# Patient Record
Sex: Male | Born: 1950 | Race: White | Hispanic: No | Marital: Married | Smoking: Never smoker
Health system: Southern US, Community
[De-identification: ages and names within clinical notes are randomized; demographics above are authoritative.]

---

## 2017-11-30 ENCOUNTER — Encounter (HOSPITAL_COMMUNITY): Payer: Self-pay | Admitting: Internal Medicine

## 2017-11-30 ENCOUNTER — Emergency Department (HOSPITAL_COMMUNITY): Payer: Medicare Other

## 2017-11-30 ENCOUNTER — Emergency Department (HOSPITAL_COMMUNITY)
Admission: EM | Admit: 2017-11-30 | Discharge: 2017-11-30 | Disposition: A | Discharge status: Home / Self Care | Payer: Medicare Other | Attending: Emergency Medicine | Admitting: Emergency Medicine

## 2017-11-30 DIAGNOSIS — W010XXA Fall on same level from slipping, tripping and stumbling without subsequent striking against object, initial encounter: Secondary | ICD-10-CM | POA: Diagnosis not present

## 2017-11-30 DIAGNOSIS — Y998 Other external cause status: Secondary | ICD-10-CM | POA: Diagnosis not present

## 2017-11-30 DIAGNOSIS — Y9289 Other specified places as the place of occurrence of the external cause: Secondary | ICD-10-CM | POA: Diagnosis not present

## 2017-11-30 DIAGNOSIS — Y939 Activity, unspecified: Secondary | ICD-10-CM | POA: Insufficient documentation

## 2017-11-30 DIAGNOSIS — S53194A Other dislocation of right ulnohumeral joint, initial encounter: Secondary | ICD-10-CM | POA: Diagnosis not present

## 2017-11-30 DIAGNOSIS — R52 Pain, unspecified: Secondary | ICD-10-CM

## 2017-11-30 DIAGNOSIS — S59901A Unspecified injury of right elbow, initial encounter: Secondary | ICD-10-CM | POA: Diagnosis present

## 2017-11-30 DIAGNOSIS — S53104A Unspecified dislocation of right ulnohumeral joint, initial encounter: Secondary | ICD-10-CM

## 2017-11-30 MED ORDER — PROPOFOL 10 MG/ML IV BOLUS
100.0000 mg | Freq: Once | INTRAVENOUS | Status: AC
  Administered 2017-11-30: 80 mg via INTRAVENOUS
  Filled 2017-11-30: qty 20

## 2017-11-30 MED ORDER — FENTANYL CITRATE (PF) 100 MCG/2ML IJ SOLN
50.0000 ug | Freq: Once | INTRAMUSCULAR | Status: AC
  Administered 2017-11-30: 50 ug via INTRAVENOUS
  Filled 2017-11-30: qty 2

## 2017-11-30 MED ORDER — FENTANYL CITRATE (PF) 100 MCG/2ML IJ SOLN
100.0000 ug | Freq: Once | INTRAMUSCULAR | Status: AC
  Administered 2017-11-30: 100 ug via INTRAVENOUS
  Filled 2017-11-30: qty 2

## 2017-11-30 MED ORDER — HYDROCODONE-ACETAMINOPHEN 5-325 MG PO TABS: 1.0000 | ORAL_TABLET | Freq: Four times a day (QID) | ORAL | 0 refills | Status: AC | PRN

## 2017-11-30 NOTE — Discharge Instructions (Signed)
Follow up with orthopedics next week.  Return to the ED for any worsening or other concerns.  Take tylenol or motrin as needed for pain.  If pain, numbness, or swelling worsens please return for further evaluation.

## 2017-11-30 NOTE — ED Triage Notes (Addendum)
Pt here after falling when exiting a crawl space at a client's home. Reportedly fell on his right elbow. Obvious deformity to elbow with possible dislocation per EMS. Denies LOC, did not hit head. Pulses present distal to injury. VSS for EMS. Given fentanyl.

## 2017-11-30 NOTE — ED Notes (Signed)
Ortho tech paged and is aware of need for long arm splint.

## 2017-11-30 NOTE — ED Provider Notes (Signed)
[facility] EMERGENCY DEPARTMENT Provider Note   CSN: [id number] Arrival date & time: 11/30/17  1433     History   Chief Complaint Chief Complaint  Patient presents with  . Elbow Injury    HPI Benjamin Dawson is a 67 y.o. male.  HPI  Patient is a 67 year old male with PMHx of HTN who presents with right elbow pain s/p FOOSH.  Patient states he was exiting a crawlspace while at a client's home for mold decontamination when he tripped and fell, landing on right elbow.  Denies any head trauma or LOC.  Denies any other injury.  Pain is currently described as achy, constant, and rated as a 6/10 in intensity.  He complains of mild numbness to lateral fifth digit otherwise denies any other paresthesias.  He is unable to range his elbow secondary to pain.  Placed in splint and given fentanyl PTA of by EMS.  Otherwise normal state of health prior to fall.  History reviewed. No pertinent past medical history.  There are no active problems to display for this patient.   History reviewed. No pertinent surgical history.      Home Medications    Prior to Admission medications   Medication Sig Start Date End Date Taking? Authorizing Provider  HYDROcodone-acetaminophen (NORCO/VICODIN) 5-325 MG tablet Take 1 tablet by mouth every 6 (six) hours as needed for up to 3 days. 11/30/17 12/03/17  Abelardo Diesel, MD    Family History No family history on file.  Social History Social History   Tobacco Use  . Smoking status: Not on file  Substance Use Topics  . Alcohol use: Not on file  . Drug use: Not on file     Allergies   Patient has no allergy information on record.   Review of Systems Review of Systems  Constitutional: Negative for chills and fever.  HENT: Negative for sore throat.   Eyes: Negative for pain and visual disturbance.  Respiratory: Negative for cough and shortness of breath.   Cardiovascular: Negative for chest pain and palpitations.    Gastrointestinal: Negative for abdominal pain, diarrhea, nausea and vomiting.  Genitourinary: Negative for dysuria and hematuria.  Musculoskeletal:       +R elbow and forearm pain  Skin: Negative for color change, rash and wound.  Neurological: Negative for seizures and syncope.  All other systems reviewed and are negative.    Physical Exam Updated Vital Signs BP (!) 141/75   Pulse 88   Temp 99.2 F (37.3 C) (Oral)   Resp 11   SpO2 97%   Physical Exam  Constitutional: He is oriented to person, place, and time. He appears well-developed and well-nourished. No distress.  HENT:  Head: Normocephalic and atraumatic.  Mouth/Throat: Oropharynx is clear and moist.  Eyes: Pupils are equal, round, and reactive to light. Conjunctivae are normal.  Neck: Normal range of motion. Neck supple.  Cardiovascular: Normal rate, regular rhythm and intact distal pulses.  Pulmonary/Chest: Effort normal and breath sounds normal. No respiratory distress.  Abdominal: Soft. He exhibits no distension. There is no tenderness. There is no guarding.  Musculoskeletal:  RUE INSPECTION:Obvious deformity to right elbow, appears dislocated. No open wounds. PALPATION: Tenderness to palpation over elbow.   ROM: Limited ROM of elbow 2/2 pain.  Full ROM of wrist. VASCULAR: Extremity warm and well-perfused. 2+ radial pulses. Capillary refill <2 seconds NEURO-SENSORY: Sensation is intact to light touch.  Subjective numbness over lateral 5th digit. NEURO-MOTOR: Motor function is intact. COMPARTMENTS: Soft.  No pain with passive stretch.   Neurological: He is alert and oriented to person, place, and time.  Skin: Skin is warm and dry. Capillary refill takes less than 2 seconds. No rash noted.  Psychiatric: He has a normal mood and affect.  Nursing note and vitals reviewed.    ED Treatments / Results  Labs (all labs ordered are listed, but only abnormal results are displayed) Labs Reviewed - No data to  display  EKG None  Radiology Dg Elbow 2 Views Right  Result Date: 11/30/2017 CLINICAL DATA:  Reduction of right elbow EXAM: RIGHT ELBOW - 2 VIEW COMPARISON:  Earlier today FINDINGS: Limited positioning given the clinical setting. Elbow has been relocated. There is marked soft tissue swelling, especially medially. No definite fracture - bony densities anteriorly appear corticated. IMPRESSION: 1. Relocated elbow. 2. Suspected small fracture fragments on prior are not clearly localized currently. 3. Limited positioning. Electronically Signed   By: Marnee Spring M.D.   On: 11/30/2017 19:26   Dg Elbow 2 Views Right  Result Date: 11/30/2017 CLINICAL DATA:  Fall today on outstretched right hand injuring elbow. EXAM: RIGHT ELBOW - 2 VIEW COMPARISON:  None. FINDINGS: Examination demonstrates dislocation at the elbow joint with rotation of the humerus. There are a few small fragments over the joint likely associated acute chip fractures without definite donor site the AP film demonstrates the distal humerus to be mostly dislocated medial and slightly posterior to the elbow joint. IMPRESSION: Right elbow dislocation. Few tiny associated fragments likely acute chip fractures without definite donor site. Electronically Signed   By: Elberta Fortis M.D.   On: 11/30/2017 17:04   Dg Forearm Right  Result Date: 11/30/2017 CLINICAL DATA:  Fall on an outstretched hand out of a crawlspace today. Initial encounter. EXAM: RIGHT FOREARM - 2 VIEW COMPARISON:  Plain films of the elbow this same day. FINDINGS: The elbow is dislocated.  No other acute abnormality is identified. IMPRESSION: Dislocated elbow as seen on dedicated plain films this same day. No other acute abnormality. Electronically Signed   By: Drusilla Kanner M.D.   On: 11/30/2017 17:02   Dg Humerus Right  Result Date: 11/30/2017 CLINICAL DATA:  Fall on an outstretched hand out of a crawlspace today. Initial encounter. EXAM: RIGHT HUMERUS - 2+ VIEW  COMPARISON:  Plain films of the right elbow this same day. FINDINGS: The elbow is dislocated as seen on dedicated plain films of the elbow this same day. The humerus is intact and the shoulder is located. IMPRESSION: Dislocated right elbow.  No other acute abnormality. Electronically Signed   By: Drusilla Kanner M.D.   On: 11/30/2017 17:02    Procedures Reduction of dislocation Date/Time: 11/30/2017 7:01 PM Performed by: Abelardo Diesel, MD Authorized by: Maia Plan, MD  Consent: Written consent obtained. Risks and benefits: risks, benefits and alternatives were discussed Consent given by: patient Patient understanding: patient states understanding of the procedure being performed Imaging studies: imaging studies available Required items: required blood products, implants, devices, and special equipment available Time out: Immediately prior to procedure a "time out" was called to verify the correct patient, procedure, equipment, support staff and site/side marked as required. Local anesthesia used: no  Anesthesia: Local anesthesia used: no  Sedation: Patient sedated: yes Vitals: Vital signs were monitored during sedation.  Patient tolerance: Patient tolerated the procedure well with no immediate complications Comments: Right elbow dislocation successfully reduced.  Neurovascularly intact 2+ radial pulses and intact sensation.  Elbow with full range of  motion.  Sling placed.    (including critical care time)  Medications Ordered in ED Medications  fentaNYL (SUBLIMAZE) injection 50 mcg (50 mcg Intravenous Given 11/30/17 1618)  propofol (DIPRIVAN) 10 mg/mL bolus/IV push 100 mg (80 mg Intravenous Given 11/30/17 1849)  fentaNYL (SUBLIMAZE) injection 100 mcg (100 mcg Intravenous Given 11/30/17 1847)     Initial Impression / Assessment and Plan / ED Course  I have reviewed the triage vital signs and the nursing notes.  Pertinent labs & imaging results that were available during my  care of the patient were reviewed by me and considered in my medical decision making (see chart for details).     Patient is a 67 year old male with PMHx of HTN who presents with right elbow pain s/p FOOSH with obvious deformity to right elbow.  On arrival he is HDS and well appearing.  Exam as above.  NVI throughout.  Subjective complaints of lateral 5th digit numbness.  XR R elbow seen for right elbow dislocation.  Few tiny associated fragments likely acute chip fractures.   XR R forearm without acute abnormality. XR R humerus with dislocated right elbow otherwise no acute abnormality.  Right elbow was successfully reduced as procedure note above using propofol sedation and fentanyl for pain.  He is neurovascular intact.  Postreduction films demonstrated reduced elbow.  No fractures identified.  Ortho consulted who recommends posterior long arm splint and follow-up next week. After splint application, patient has significant improvement of pain.  Remains neurovascularly intact with strong radial pulses and < 2 sec capillary refill.  He does continue to endorse mild numbness to lateral 5th digit otherwise denies all other paresthesias.  Etiology likely 2/2 swelling.  Will give short course of Norco. Stable for d/c home.  Old records reviewed.  Imaging and labs reviewed and interpreted by myself and attending and used in the MDM.  Addressed patient question and concerns.  Reviewed discharged instructions with strict precautions given.  Advised patient to schedule follow-up with primary care provider.  Patient verbalized understanding and agrees with plan.  Patient stable at discharge.  The plan for this patient was discussed with Dr. Jacqulyn Bath who voiced agreement and who oversaw evaluation and treatment of this patient.  Final Clinical Impressions(s) / ED Diagnoses   Final diagnoses:  Closed dislocation of right elbow, initial encounter    ED Discharge Orders         Ordered     HYDROcodone-acetaminophen (NORCO/VICODIN) 5-325 MG tablet  Every 6 hours PRN     11/30/17 2006           Abelardo Diesel, MD 12/01/17 1610    Maia Plan, MD 12/01/17 1008

## 2017-12-01 NOTE — ED Provider Notes (Signed)
.  Sedation Date/Time: 12/01/2017 10:03 AM Performed by: Maia Plan, MD Authorized by: Maia Plan, MD   Consent:    Consent obtained:  Verbal   Consent given by:  Patient   Risks discussed:  Allergic reaction, dysrhythmia, inadequate sedation, nausea, prolonged hypoxia resulting in organ damage, prolonged sedation necessitating reversal, respiratory compromise necessitating ventilatory assistance and intubation and vomiting   Alternatives discussed:  Analgesia without sedation, anxiolysis and regional anesthesia Universal protocol:    Procedure explained and questions answered to patient or proxy's satisfaction: yes     Relevant documents present and verified: yes     Test results available and properly labeled: yes     Imaging studies available: yes     Required blood products, implants, devices, and special equipment available: yes     Site/side marked: yes     Immediately prior to procedure a time out was called: yes     Patient identity confirmation method:  Verbally with patient Indications:    Procedure necessitating sedation performed by:  Physician performing sedation Pre-sedation assessment:    Time since last food or drink:  4 hours   ASA classification: class 1 - normal, healthy patient     Neck mobility: normal     Mouth opening:  3 or more finger widths   Thyromental distance:  4 finger widths   Mallampati score:  I - soft palate, uvula, fauces, pillars visible   Pre-sedation assessments completed and reviewed: airway patency, cardiovascular function, hydration status, mental status, nausea/vomiting, pain level, respiratory function and temperature   Immediate pre-procedure details:    Reassessment: Patient reassessed immediately prior to procedure     Reviewed: vital signs, relevant labs/tests and NPO status     Verified: bag valve mask available, emergency equipment available, intubation equipment available, IV patency confirmed, oxygen available and suction  available   Procedure details (see MAR for exact dosages):    Preoxygenation:  Nasal cannula   Sedation:  Propofol   Analgesia:  Fentanyl   Intra-procedure monitoring:  Blood pressure monitoring, cardiac monitor, continuous pulse oximetry, frequent LOC assessments, frequent vital sign checks and continuous capnometry   Intra-procedure events: none     Intra-procedure management:  Supplemental oxygen   Total Provider sedation time (minutes):  35 Post-procedure details:    Attendance: Constant attendance by certified staff until patient recovered     Recovery: Patient returned to pre-procedure baseline     Post-sedation assessments completed and reviewed: airway patency, cardiovascular function, hydration status, mental status, nausea/vomiting, pain level, respiratory function and temperature     Patient is stable for discharge or admission: yes     Patient tolerance:  Tolerated well, no immediate complications   Alona Bene, MD   Maia Plan, MD 12/01/17 1004

## 2017-12-04 ENCOUNTER — Encounter (INDEPENDENT_AMBULATORY_CARE_PROVIDER_SITE_OTHER): Payer: Self-pay | Admitting: Orthopaedic Surgery

## 2017-12-04 ENCOUNTER — Ambulatory Visit (INDEPENDENT_AMBULATORY_CARE_PROVIDER_SITE_OTHER): Payer: Medicare Other | Admitting: Orthopaedic Surgery

## 2017-12-04 ENCOUNTER — Ambulatory Visit (INDEPENDENT_AMBULATORY_CARE_PROVIDER_SITE_OTHER): Payer: Self-pay

## 2017-12-04 VITALS — BP 120/85 | HR 72 | Ht 70.0 in | Wt 250.0 lb

## 2017-12-04 DIAGNOSIS — S53104D Unspecified dislocation of right ulnohumeral joint, subsequent encounter: Secondary | ICD-10-CM | POA: Diagnosis not present

## 2017-12-04 DIAGNOSIS — M25521 Pain in right elbow: Secondary | ICD-10-CM

## 2017-12-04 DIAGNOSIS — S53104A Unspecified dislocation of right ulnohumeral joint, initial encounter: Secondary | ICD-10-CM | POA: Insufficient documentation

## 2017-12-04 NOTE — Progress Notes (Signed)
   Office Visit Note   Patient: Benjamin Dawson           Date of Birth: 07/08/1950           MRN: [id number] Visit Date: 12/04/2017              Requested by: No referring provider defined for this encounter. PCP: Patient, No Pcp Per   Assessment & Plan: Visit Diagnoses:  1. Pain in right elbow   2. Closed dislocation of right elbow, subsequent encounter     Plan: Patient will remain in the sling I will check him back again in 9 to 10 days.  No x-ray needed on return.  On office follow-up we will remove his splint.  Follow-Up Instructions: Return in about 10 days (around 12/14/2017).   Orders:  Orders Placed This Encounter  Procedures  . XR Elbow 2 Views Right   No orders of the defined types were placed in this encounter.     Procedures: No procedures performed   Clinical Data: No additional findings.   Subjective: Chief Complaint  Patient presents with  . Right Elbow - Pain    DOI 11/30/17    HPI 67 year old male fell 11/30/2017 outstretched arm suffering a right elbow dislocation.  He was reduced in the emergency room by the ER staff with conscious sedation.  He fell 6 to 8 feet out of a crawl space.  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  He is in a posterior sling just taking ibuprofen.  No numbness or tingling in his hand no past history of elbow injury prior to his 11/30/2017 injury.  No other areas of injury besides his elbow.  Review of Systems 14 point review of systems positive for hypertension otherwise noncontributory to his HPI.  Objective: Vital Signs: BP 120/85   Pulse 72   Ht 5\' 10"  (1.778 m)   Wt 250 lb (113.4 kg)   BMI 35.87 kg/m   Physical Exam  Constitutional: He is oriented to person, place, and time. He appears well-developed and well-nourished.  HENT:  Head: Normocephalic and atraumatic.  Eyes: Pupils are equal, round, and reactive to light. EOM are normal.  Neck: No tracheal deviation present. No thyromegaly  present.  Cardiovascular: Normal rate.  Pulmonary/Chest: Effort normal. He has no wheezes.  Abdominal: Soft. Bowel sounds are normal.  Neurological: He is alert and oriented to person, place, and time.  Skin: Skin is warm and dry. Capillary refill takes less than 2 seconds.  Psychiatric: He has a normal mood and affect. His behavior is normal. Judgment and thought content normal.    Ortho Exam thumb flexion extension finger extension is intact.  Normal sensation in the hands per fundi supple my are active.  Interossei are functioning.  Good capillary refill minimal swelling to the digits.  Well-padded posterior splint and sling.  Specialty Comments:  No specialty comments available.  Imaging: No results found.   PMFS History: There are no active problems to display for this patient.  No past medical history on file.  No family history on file.  No past surgical history on file. Social History   Occupational History  . Not on file  Tobacco Use  . Smoking status: Never Smoker  . Smokeless tobacco: Never Used  Substance and Sexual Activity  . Alcohol use: Yes    Comment: occasionally  . Drug use: Never  . Sexual activity: Not on file

## 2017-12-13 ENCOUNTER — Ambulatory Visit (INDEPENDENT_AMBULATORY_CARE_PROVIDER_SITE_OTHER): Payer: Medicare Other | Admitting: Orthopaedic Surgery

## 2017-12-13 ENCOUNTER — Encounter (INDEPENDENT_AMBULATORY_CARE_PROVIDER_SITE_OTHER): Payer: Self-pay | Admitting: Orthopaedic Surgery

## 2017-12-13 VITALS — BP 155/94 | HR 77 | Ht 70.0 in | Wt 250.0 lb

## 2017-12-13 DIAGNOSIS — S53104D Unspecified dislocation of right ulnohumeral joint, subsequent encounter: Secondary | ICD-10-CM | POA: Diagnosis not present

## 2017-12-13 NOTE — Progress Notes (Signed)
   Office Visit Note   Patient: Benjamin Dawson           Date of Birth: Aug 06, 1950           MRN: [id number] Visit Date: 12/13/2017              Requested by: No referring provider defined for this encounter. PCP: Patient, No Pcp Per   Assessment & Plan: Visit Diagnoses:  1. Closed dislocation of right elbow, subsequent encounter     Plan: 2 weeks.  He will work on elbow flexion extension exercises discussed in detail.  He will work on finger flexion extension decreased hand edema elevating his hand.  Return 2 weeks to check his progress.  Follow-Up Instructions: Return in about 2 weeks (around 12/27/2017).   Orders:  No orders of the defined types were placed in this encounter.  No orders of the defined types were placed in this encounter.     Procedures: No procedures performed   Clinical Data: No additional findings.   Subjective: Chief Complaint  Patient presents with  . Right Elbow - Follow-up    DOI 11/30/17    HPI 67 year old male returns post dislocation right elbow 2 weeks ago.  He has swelling in his hand and fingers and will work on finger range of motion.  He has flexion to 120 today extension to 60 degrees flexion.  He will work on exercises for elbow flexion and extension.  He already has fairly good supination pronation.  We went over appropriate way to do his exercises he will do a few times a day and work on finger range of motion. Is using ibuprofen for pain. Review of Systems updated unchanged from last office visit.   Objective: Vital Signs: BP (!) 155/94   Pulse 77   Ht 5\' 10"  (1.778 m)   Wt 250 lb (113.4 kg)   BMI 35.87 kg/m   Physical Exam  Constitutional: He is oriented to person, place, and time. He appears well-developed and well-nourished.  HENT:  Head: Normocephalic and atraumatic.  Eyes: Pupils are equal, round, and reactive to light. EOM are normal.  Neck: No tracheal deviation present. No thyromegaly present.  Cardiovascular:  Normal rate.  Pulmonary/Chest: Effort normal. He has no wheezes.  Abdominal: Soft. Bowel sounds are normal.  Neurological: He is alert and oriented to person, place, and time.  Skin: Skin is warm and dry. Capillary refill takes less than 2 seconds.  Psychiatric: He has a normal mood and affect. His behavior is normal. Judgment and thought content normal.    Ortho Exam mild pitting edema of the dorsum of the hand.  He lacks to 3 cm fingertip to palm but improved by greater than a centimeter just working on it here in the office today.  The hand is intact.  Specialty Comments:  No specialty comments available.  Imaging: No results found.   PMFS History: Patient Active Problem List   Diagnosis Date Noted  . Closed dislocation of right elbow 12/04/2017   No past medical history on file.  No family history on file.  No past surgical history on file. Social History   Occupational History  . Not on file  Tobacco Use  . Smoking status: Never Smoker  . Smokeless tobacco: Never Used  Substance and Sexual Activity  . Alcohol use: Yes    Comment: occasionally  . Drug use: Never  . Sexual activity: Not on file

## 2017-12-28 ENCOUNTER — Ambulatory Visit (INDEPENDENT_AMBULATORY_CARE_PROVIDER_SITE_OTHER): Payer: Medicare Other | Admitting: Orthopaedic Surgery

## 2017-12-28 ENCOUNTER — Encounter (INDEPENDENT_AMBULATORY_CARE_PROVIDER_SITE_OTHER): Payer: Self-pay | Admitting: Orthopaedic Surgery

## 2017-12-28 VITALS — BP 143/92 | HR 62 | Ht 70.0 in | Wt 250.0 lb

## 2017-12-28 DIAGNOSIS — S53104D Unspecified dislocation of right ulnohumeral joint, subsequent encounter: Secondary | ICD-10-CM | POA: Diagnosis not present

## 2017-12-28 NOTE — Addendum Note (Signed)
Addended by: Rogers SeedsYEATTS, Cayson Kalb M on: 12/28/2017 01:54 PM   Modules accepted: Orders

## 2017-12-28 NOTE — Progress Notes (Signed)
Office Visit Note   Patient: Benjamin Dawson           Date of Birth: 07-18-1950           MRN: [id number] Visit Date: 12/28/2017              Requested by: No referring provider defined for this encounter. PCP: Patient, No Pcp Per   Assessment & Plan: Visit Diagnoses:  1. Closed dislocation of right elbow, subsequent encounter       With posttraumatic hand and finger edema and stiffness.  Plan: His elbow range of motion is significantly improved.  He lacks about 6 inches touching his thumb to his shoulder. He extends to about 40 degrees.  Continue to work on extension exercises and flexion.  We will refer him to hand therapy for edema reduction and finger and hand range of motion.  Patient self-employed.  Recheck 3 weeks.  Follow-Up Instructions: Return in about 3 weeks (around 01/18/2018).   Orders:  No orders of the defined types were placed in this encounter.  No orders of the defined types were placed in this encounter.     Procedures: No procedures performed   Clinical Data: No additional findings.   Subjective: Chief Complaint  Patient presents with  . Right Elbow - Follow-up    HPI follow-up right closed elbow dislocation date of injury 11/30/2017.  He is having minimal pain is been using his elbow still has considerable swelling in his fingers and hands and difficulty making a fist.  Review of Systems updated unchanged   Objective: Vital Signs: BP (!) 143/92   Pulse 62   Ht 5\' 10"  (1.778 m)   Wt 250 lb (113.4 kg)   BMI 35.87 kg/m   Physical Exam  Constitutional: He is oriented to person, place, and time. He appears well-developed and well-nourished.  HENT:  Head: Normocephalic and atraumatic.  Eyes: Pupils are equal, round, and reactive to light. EOM are normal.  Neck: No tracheal deviation present. No thyromegaly present.  Cardiovascular: Normal rate.  Pulmonary/Chest: Effort normal. He has no wheezes.  Abdominal: Soft. Bowel sounds are  normal.  Neurological: He is alert and oriented to person, place, and time.  Skin: Skin is warm and dry. Capillary refill takes less than 2 seconds.  Psychiatric: He has a normal mood and affect. His behavior is normal. Judgment and thought content normal.    Ortho Exam ecchymosis is resolved.  Right elbow has 120 flexion and lacks 40 degrees reaching full extension.  Considerable swelling in the hand and wrist and fingers.  He lacks several centimeters touching fingers distal palmar crease due to edema and has pitting edema on the dorsum of the hand.  We will refer him to hand therapy someplace close to his home which is on the other side of [place] with have a hand therapist and they can work on hand edema.  You can  Specialty Comments:  No specialty comments available.  Imaging: No results found.   PMFS History: Patient Active Problem List   Diagnosis Date Noted  . Closed dislocation of right elbow 12/04/2017   History reviewed. No pertinent past medical history.  History reviewed. No pertinent family history.  History reviewed. No pertinent surgical history. Social History   Occupational History  . Not on file  Tobacco Use  . Smoking status: Never Smoker  . Smokeless tobacco: Never Used  Substance and Sexual Activity  . Alcohol use: Yes    Comment: occasionally  .  Drug use: Never  . Sexual activity: Not on file

## 2018-01-18 ENCOUNTER — Encounter (INDEPENDENT_AMBULATORY_CARE_PROVIDER_SITE_OTHER): Payer: Self-pay | Admitting: Orthopaedic Surgery

## 2018-01-18 ENCOUNTER — Ambulatory Visit (INDEPENDENT_AMBULATORY_CARE_PROVIDER_SITE_OTHER): Payer: Medicare Other | Admitting: Orthopaedic Surgery

## 2018-01-18 VITALS — BP 142/94 | HR 70 | Ht 70.0 in | Wt 250.0 lb

## 2018-01-18 DIAGNOSIS — S53104D Unspecified dislocation of right ulnohumeral joint, subsequent encounter: Secondary | ICD-10-CM

## 2018-01-18 NOTE — Progress Notes (Addendum)
   Office Visit Note   Patient: Benjamin StalkerDavid Dawson           Date of Birth: 1950-10-29           MRN: [id number] Visit Date: 01/18/2018              Requested by: No referring provider defined for this encounter. PCP: Patient, No Pcp Per   Assessment & Plan: Visit Diagnoses:  1. Closed dislocation of right elbow, subsequent encounter     Plan: Patient is continuing therapy.  His elbow flexion looks good.  He lacks a few inches touching thumb to shoulder.  Hand edema is decreasing I gave him additional exercises.  He will continue to work on elbow extension and we discussed that he should not be able to get full extension typically from this injury.  We gave him some additional exercises for his fingers to work on flexion and decrease hand edema he has a compressive glove on currently.  Recheck 3 weeks.  Follow-Up Instructions: Return in about 3 weeks (around 02/08/2018).   Orders:  No orders of the defined types were placed in this encounter.  No orders of the defined types were placed in this encounter.     Procedures: No procedures performed   Clinical Data: No additional findings.   Subjective: Chief Complaint  Patient presents with  . Right Elbow - Follow-up    DOI 11/30/17    HPI 67 year old male returns post elbow dislocation 11/30/2017.  Patient is in therapy as a compressive glove is working hard on motion.  Therapy progress note is reviewed with patient and discussed from 01/16/2018.  Review of Systems reviewed updated unchanged.   Objective: Vital Signs: BP (!) 142/94   Pulse 70   Ht 5\' 10"  (1.778 m)   Wt 250 lb (113.4 kg)   BMI 35.87 kg/m   Physical Exam  Constitutional: He is oriented to person, place, and time. He appears well-developed and well-nourished.  HENT:  Head: Normocephalic and atraumatic.  Eyes: Pupils are equal, round, and reactive to light. EOM are normal.  Neck: No tracheal deviation present. No thyromegaly present.  Cardiovascular:  Normal rate.  Pulmonary/Chest: Effort normal. He has no wheezes.  Abdominal: Soft. Bowel sounds are normal.  Neurological: He is alert and oriented to person, place, and time.  Skin: Skin is warm and dry. Capillary refill takes less than 2 seconds.  Psychiatric: He has a normal mood and affect. His behavior is normal. Judgment and thought content normal.    Ortho Exam patient flexes past 130.  Still has 10 degrees flexion contracture.  Supination pronation is 77 and 89 degrees.  Continue to work on finger range of motion and still lacks 2 cm fingertip to distal palmar crease.  Specialty Comments:  No specialty comments available.  Imaging: No results found.   PMFS History: Patient Active Problem List   Diagnosis Date Noted  . Closed dislocation of right elbow 12/04/2017   No past medical history on file.  No family history on file.  No past surgical history on file. Social History   Occupational History  . Not on file  Tobacco Use  . Smoking status: Never Smoker  . Smokeless tobacco: Never Used  Substance and Sexual Activity  . Alcohol use: Yes    Comment: occasionally  . Drug use: Never  . Sexual activity: Not on file

## 2018-02-12 ENCOUNTER — Encounter (INDEPENDENT_AMBULATORY_CARE_PROVIDER_SITE_OTHER): Payer: Self-pay | Admitting: Orthopaedic Surgery

## 2018-02-12 ENCOUNTER — Ambulatory Visit (INDEPENDENT_AMBULATORY_CARE_PROVIDER_SITE_OTHER): Payer: Medicare Other | Admitting: Orthopaedic Surgery

## 2018-02-12 VITALS — BP 150/92 | HR 68 | Ht 70.0 in | Wt 250.0 lb

## 2018-02-12 DIAGNOSIS — S53104D Unspecified dislocation of right ulnohumeral joint, subsequent encounter: Secondary | ICD-10-CM | POA: Diagnosis not present

## 2018-02-12 NOTE — Progress Notes (Signed)
Office Visit Note   Patient: Benjamin Dawson           Date of Birth: 05-Jul-1950           MRN: [id number] Visit Date: 02/12/2018              Requested by: No referring provider defined for this encounter. PCP: Patient, No Pcp Per   Assessment & Plan: Visit Diagnoses:  1. Closed dislocation of right elbow, subsequent encounter     Plan: Follow-up right closed elbow dislocation.  He has 20 degree flexion contracture flexion is good still has mild swelling of his hand is using his hand is back at work doing most activities.  He can continue with strengthening and finger range of motion and will return here on a as needed basis.  He is happy with the results of treatment.  Follow-Up Instructions: No follow-ups on file.   Orders:  No orders of the defined types were placed in this encounter.  No orders of the defined types were placed in this encounter.     Procedures: No procedures performed   Clinical Data: No additional findings.   Subjective: Chief Complaint  Patient presents with  . Right Elbow - Follow-up    DOI 11/30/17    HPI 2 and half months post right closed elbow dislocation reduction on 11/30/2017.  He is participating in physical therapy is now working on Print production plannerstrengthening.  Review of Systems reviewed and updated unchanged from last office visit.   Objective: Vital Signs: BP (!) 150/92   Pulse 68   Ht 5\' 10"  (1.778 m)   Wt 250 lb (113.4 kg)   BMI 35.87 kg/m   Physical Exam Constitutional:      Appearance: He is well-developed.  HENT:     Head: Normocephalic and atraumatic.  Eyes:     Pupils: Pupils are equal, round, and reactive to light.  Neck:     Thyroid: No thyromegaly.     Trachea: No tracheal deviation.  Cardiovascular:     Rate and Rhythm: Normal rate.  Pulmonary:     Effort: Pulmonary effort is normal.     Breath sounds: No wheezing.  Abdominal:     General: Bowel sounds are normal.     Palpations: Abdomen is soft.  Skin:  General: Skin is warm and dry.     Capillary Refill: Capillary refill takes less than 2 seconds.  Neurological:     Mental Status: He is alert and oriented to person, place, and time.  Psychiatric:        Behavior: Behavior normal.        Thought Content: Thought content normal.        Judgment: Judgment normal.     Ortho Exam 25 235 degrees range of motion.  He lacks a few millimeters touching fingertip distal palmar crease.  And CP still lack about 10 degrees full extension on the right normal on the left.  Station and is normal.  Supination pronation is symmetrical.  Specialty Comments:  No specialty comments available.  Imaging: No results found.   PMFS History: Patient Active Problem List   Diagnosis Date Noted  . Closed dislocation of right elbow 12/04/2017   No past medical history on file.  No family history on file.  No past surgical history on file. Social History   Occupational History  . Not on file  Tobacco Use  . Smoking status: Never Smoker  . Smokeless tobacco: Never Used  Substance  and Sexual Activity  . Alcohol use: Yes    Comment: occasionally  . Drug use: Never  . Sexual activity: Not on file

## 2020-01-03 IMAGING — DX DG HUMERUS 2V *R*
1 series · 1 of 1 positions shown · non-contrast
Comparison: Plain films of the right elbow this same day.

CLINICAL DATA: Fall on an outstretched hand out of a crawlspace
today. Initial encounter.

EXAM:
RIGHT HUMERUS - 2+ VIEW

[humerus ap]
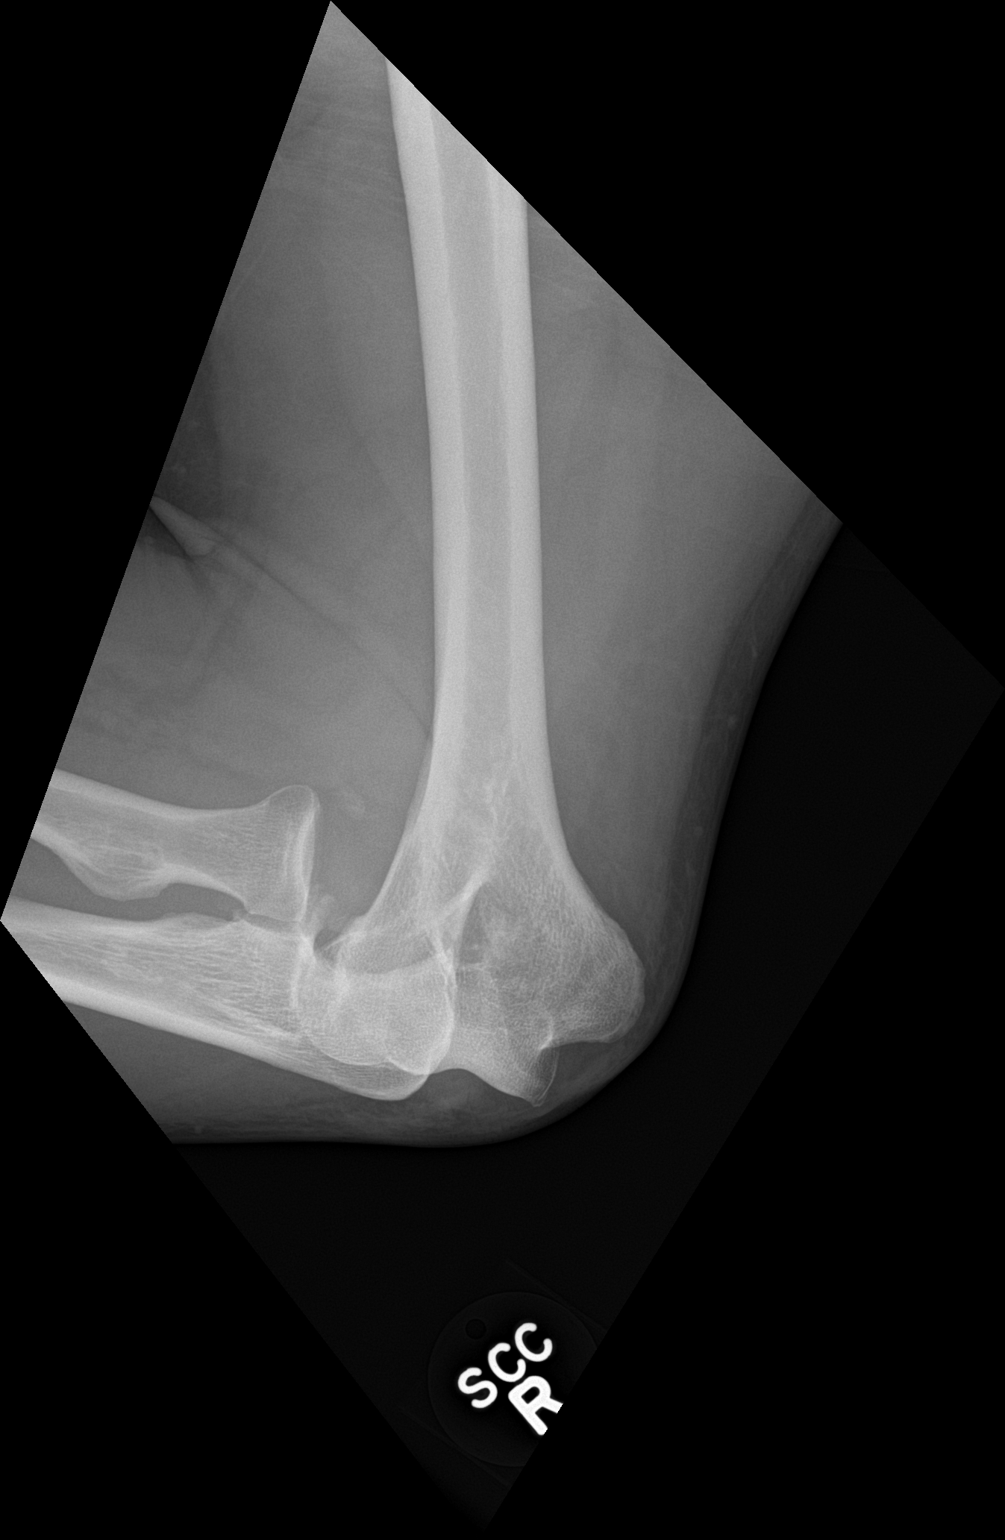

[1 of 1 positions shown; findings below may reference images not displayed]

FINDINGS: The elbow is dislocated as seen on dedicated plain films of the
elbow this same day. The humerus is intact and the shoulder is
located.
IMPRESSION: Dislocated right elbow.  No other acute abnormality.

## 2020-01-03 IMAGING — DX DG FOREARM 2V*R*
2 series · 2 of 2 positions shown · non-contrast
Comparison: Plain films of the elbow this same day.

CLINICAL DATA: Fall on an outstretched hand out of a crawlspace
today. Initial encounter.

EXAM:
RIGHT FOREARM - 2 VIEW

[forearm ap]
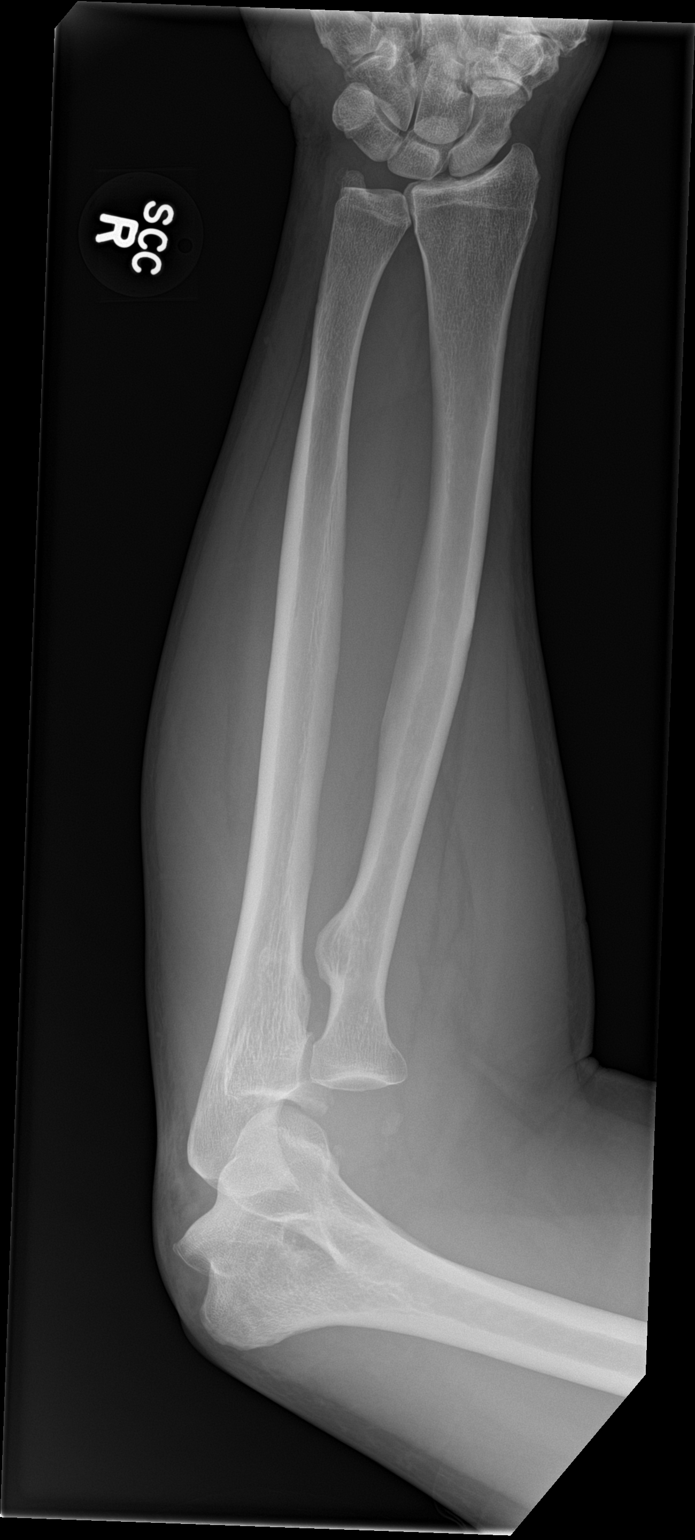

[forearm lat]
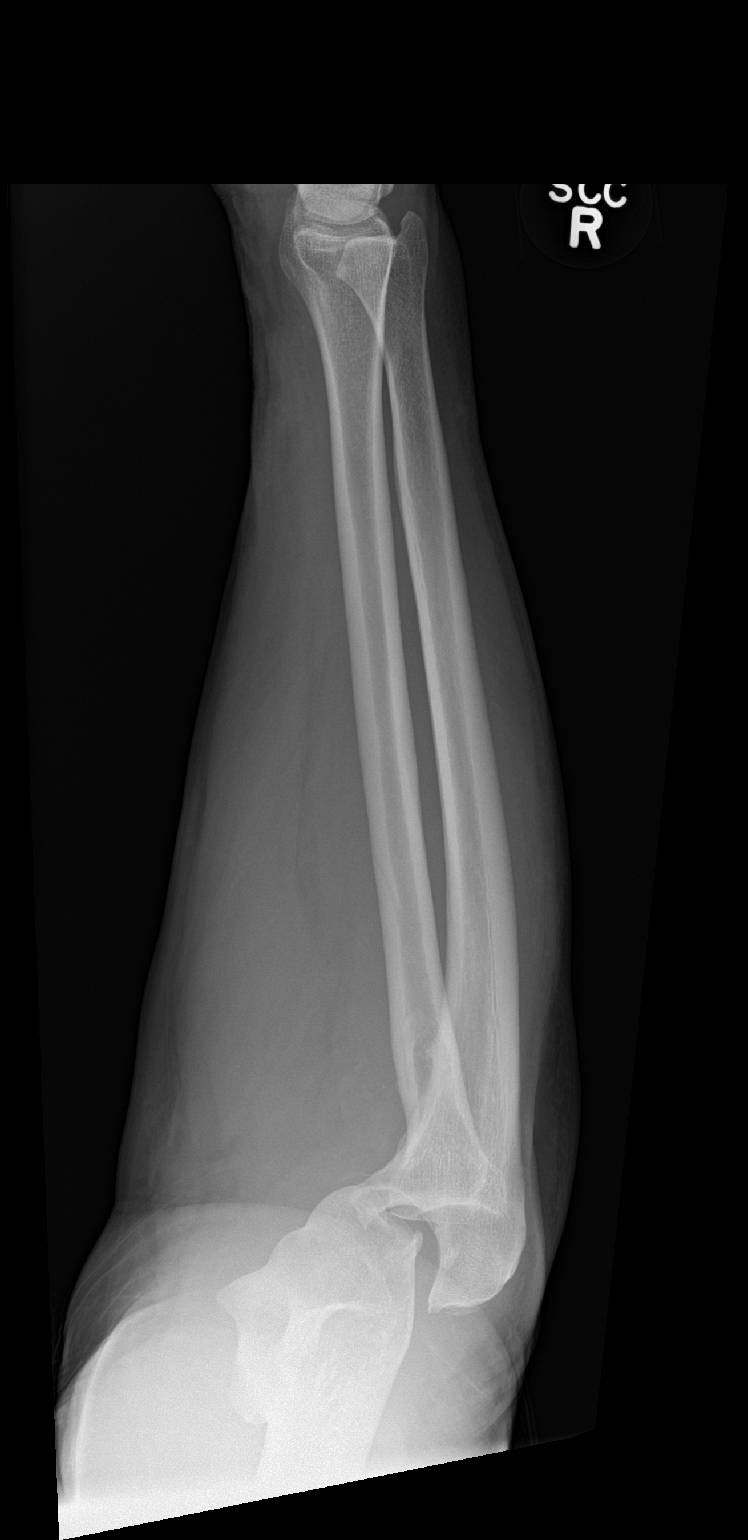

[2 of 2 positions shown; findings below may reference images not displayed]

FINDINGS: The elbow is dislocated.  No other acute abnormality is identified.
IMPRESSION: Dislocated elbow as seen on dedicated plain films this same day. No
other acute abnormality.

## 2020-01-03 IMAGING — DX DG ELBOW 2V*R*
1 series · 1 of 1 positions shown · non-contrast
Comparison: None.

CLINICAL DATA: Fall today on outstretched right hand injuring
elbow.

EXAM:
RIGHT ELBOW - 2 VIEW

[elbow ap]
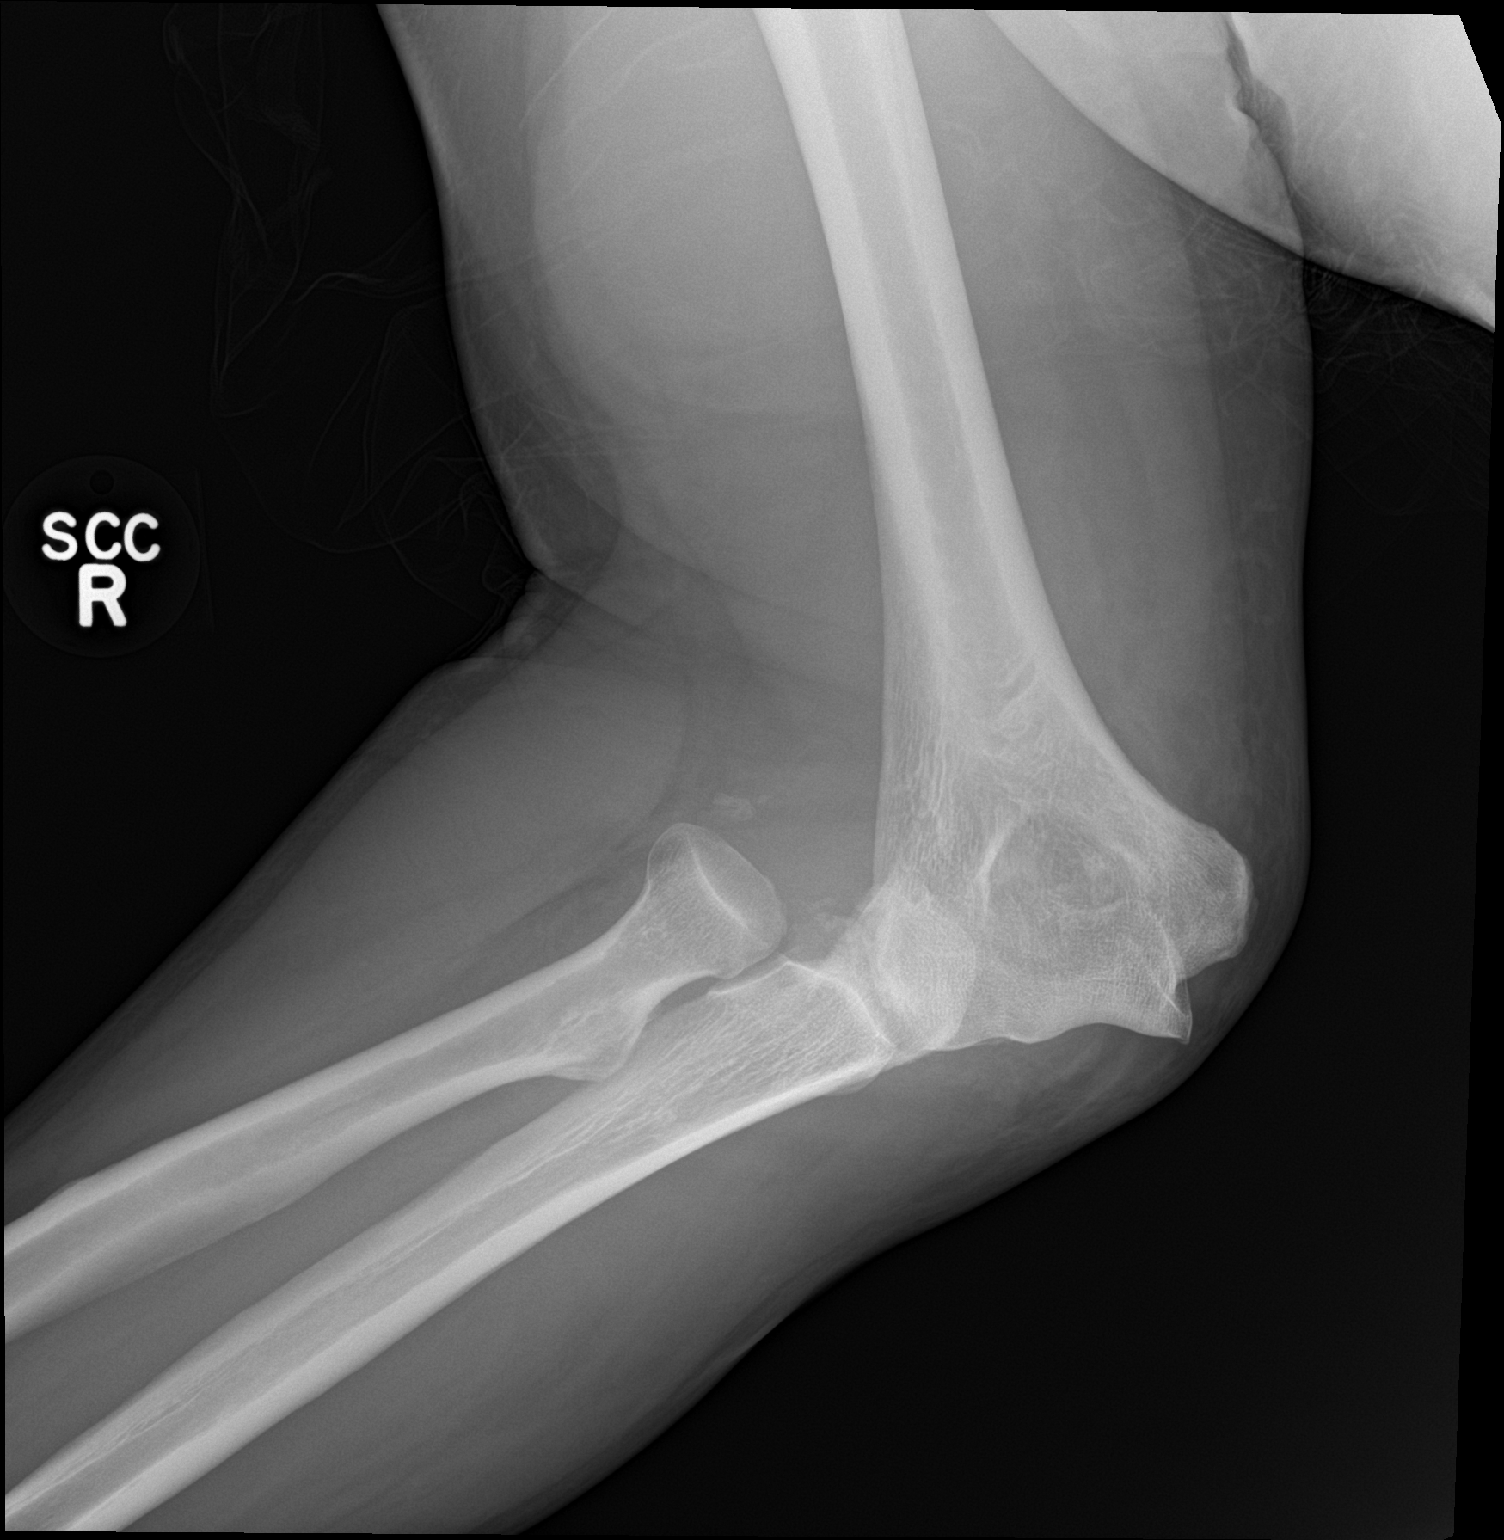

[1 of 1 positions shown; findings below may reference images not displayed]

FINDINGS: Examination demonstrates dislocation at the elbow joint with
rotation of the humerus. There are a few small fragments over the
joint likely associated acute chip fractures without definite donor
site the AP film demonstrates the distal humerus to be mostly
dislocated medial and slightly posterior to the elbow joint.
IMPRESSION: Right elbow dislocation. Few tiny associated fragments likely acute
chip fractures without definite donor site.

## 2020-01-03 IMAGING — DX DG ELBOW 2V*R*
1 series · 2 of 2 positions shown · non-contrast
Comparison: Earlier today

CLINICAL DATA: Reduction of right elbow

EXAM:
RIGHT ELBOW - 2 VIEW

[Series 1: elbow · 0.14mm/px · 2 of 2 slices shown]
[im 1/2]
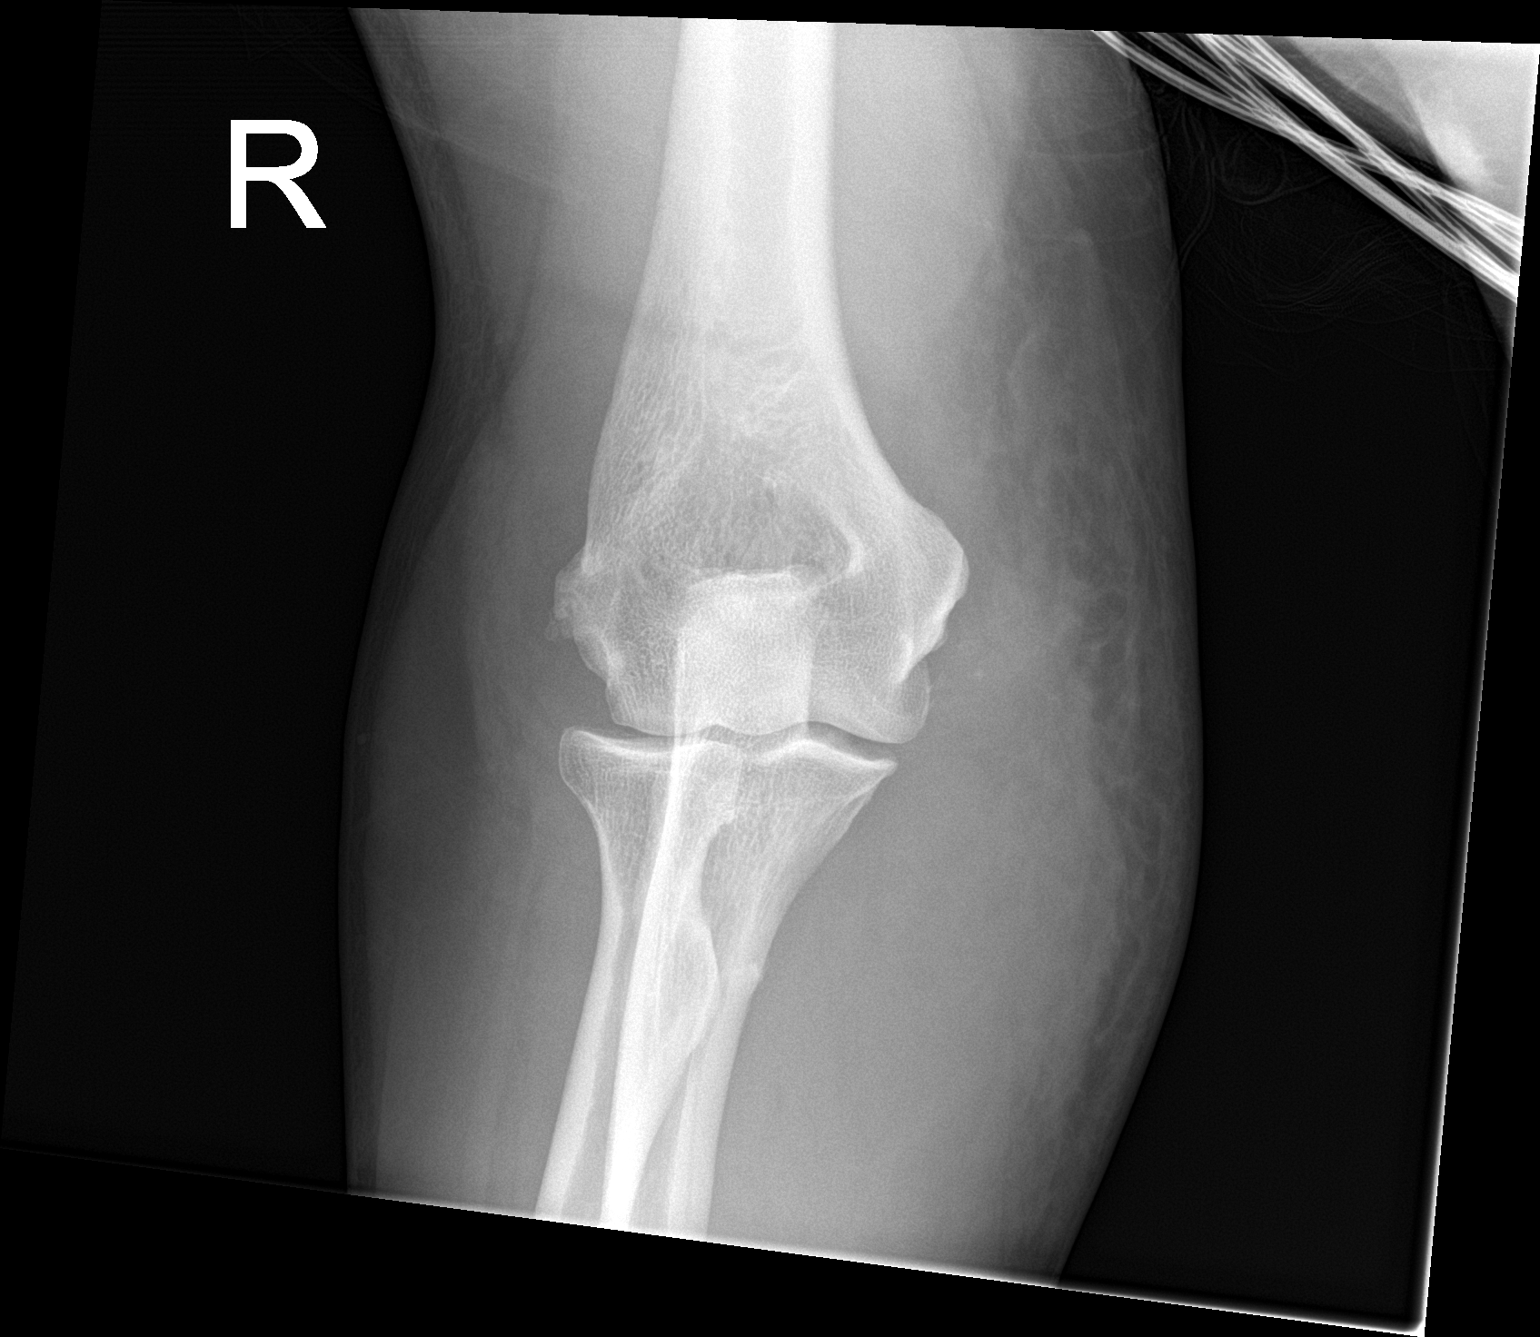
[im 2/2]
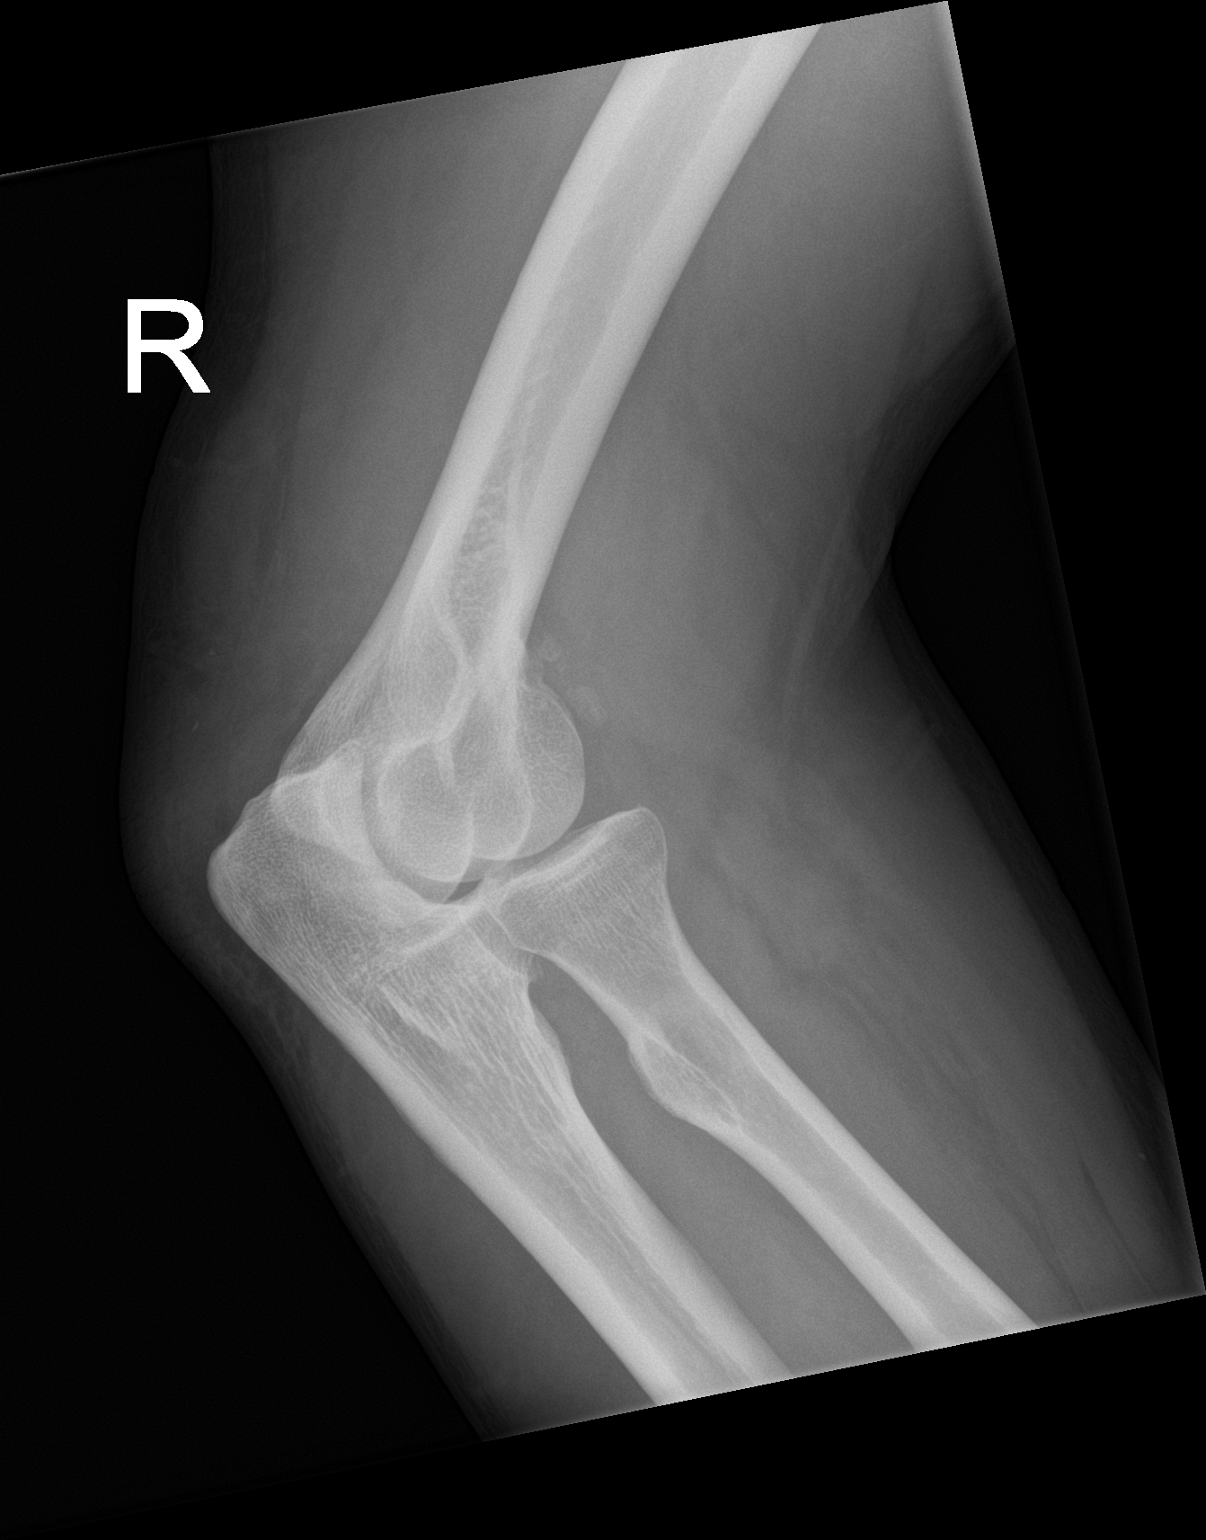

[2 of 2 positions shown; findings below may reference images not displayed]

FINDINGS: Limited positioning given the clinical setting. Elbow has been
relocated. There is marked soft tissue swelling, especially
medially. No definite fracture - bony densities anteriorly appear
corticated.
IMPRESSION: 1. Relocated elbow.
2. Suspected small fracture fragments on prior are not clearly
localized currently.
3. Limited positioning.
